# Patient Record
Sex: Female | Born: 1995 | Race: Black or African American | Hispanic: No | Marital: Single | Smoking: Former smoker
Health system: Southern US, Community
[De-identification: ages and names within clinical notes are randomized; demographics above are authoritative.]

## PROBLEM LIST (undated history)

## (undated) ENCOUNTER — Inpatient Hospital Stay (HOSPITAL_COMMUNITY): Payer: Self-pay

## (undated) DIAGNOSIS — A6000 Herpesviral infection of urogenital system, unspecified: Secondary | ICD-10-CM

## (undated) DIAGNOSIS — N390 Urinary tract infection, site not specified: Secondary | ICD-10-CM

## (undated) DIAGNOSIS — I440 Atrioventricular block, first degree: Secondary | ICD-10-CM

## (undated) HISTORY — PX: NO PAST SURGERIES: SHX2092

---
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
## 2014-08-14 IMAGING — US US OB TRANSVAGINAL
1 series · 14 of 28 positions shown · non-contrast
Comparison: None for this pregnancy

CLINICAL DATA: Abdominal and pelvic pain, early pregnancy, history
of spontaneous abortion x3; quantitative beta HCG 7301

OBSTETRIC <14 WK US AND TRANSVAGINAL OB US
TECHNIQUE: Both transabdominal and transvaginal ultrasound
examinations were performed for complete evaluation of the
gestation as well as the maternal uterus, adnexal regions, and
pelvic cul-de-sac.  Transvaginal technique was performed to assess
early pregnancy.

[Series 1: us ob comp less 14 wks · 14 of 40 slices shown]
[im 2/40]
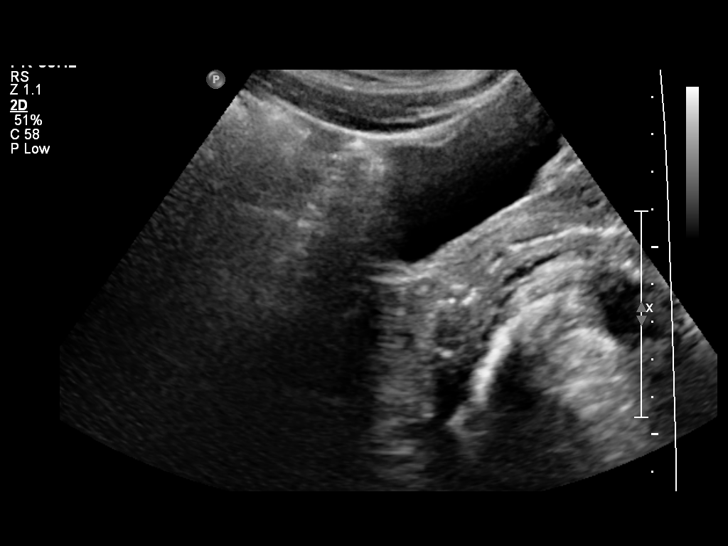
[im 5/40]
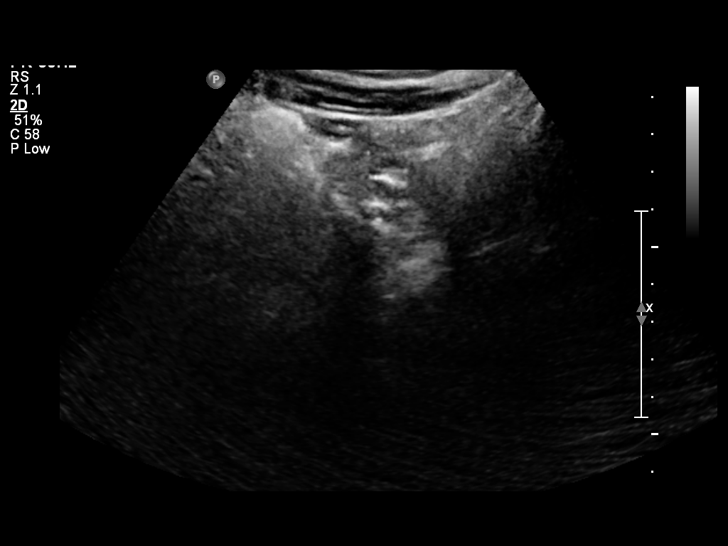
[im 8/40]
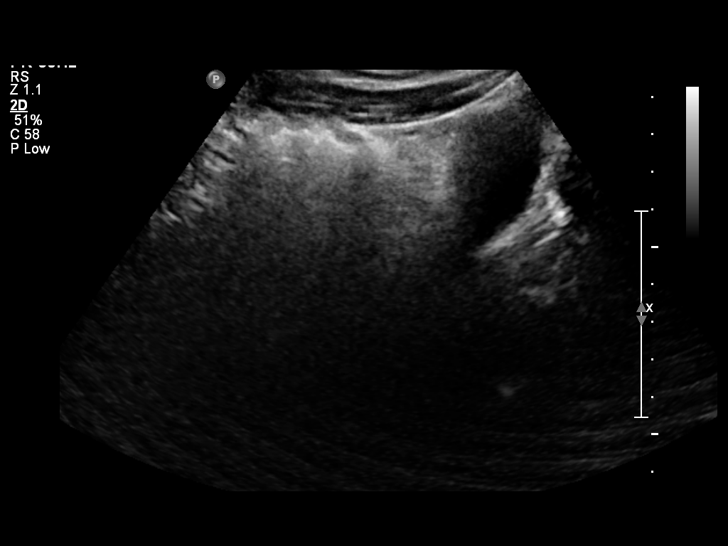
[im 11/40]
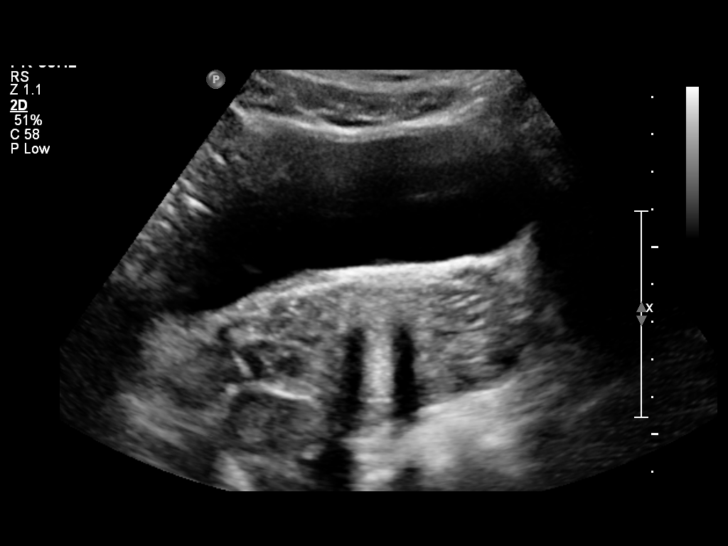
[im 14/40]
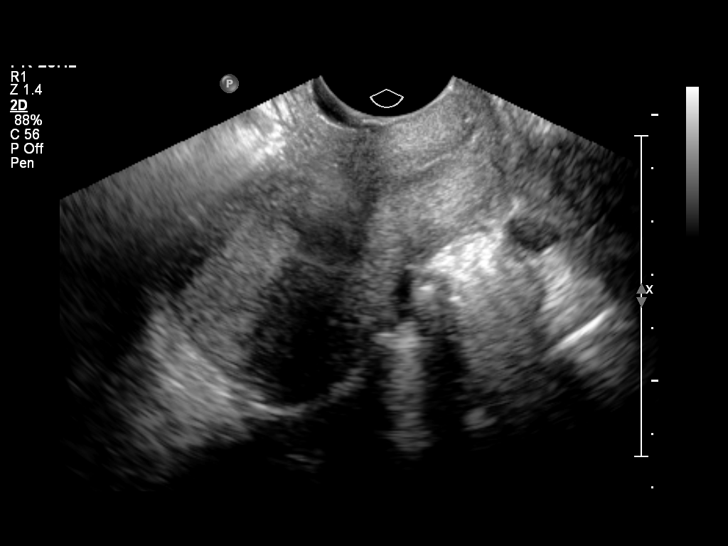
[im 16/40]
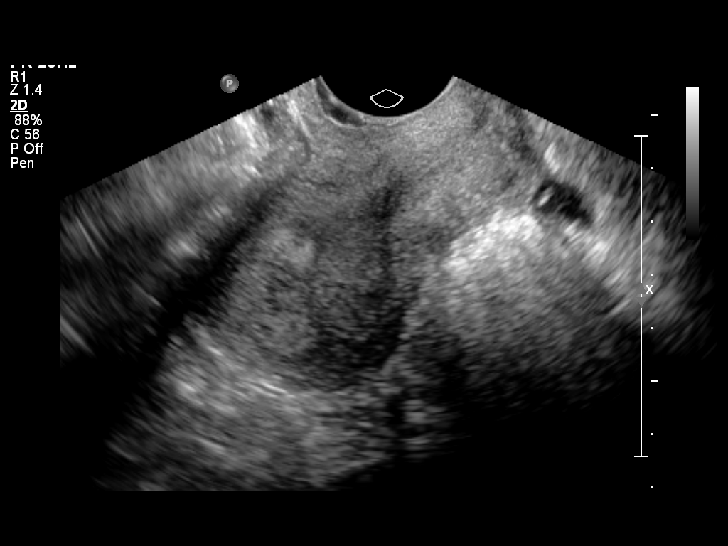
[im 19/40]
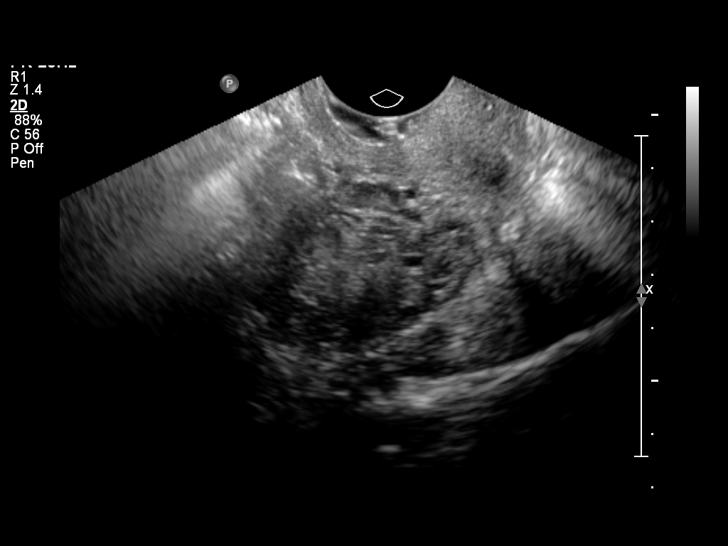
[im 22/40]
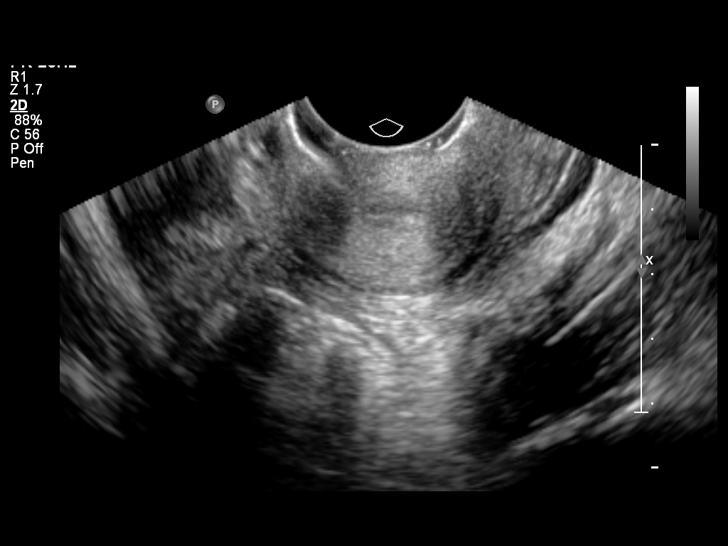
[im 25/40]
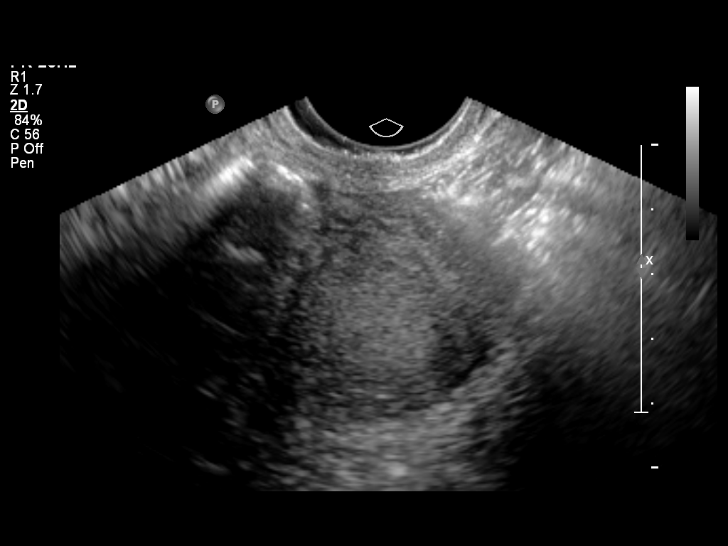
[im 28/40]
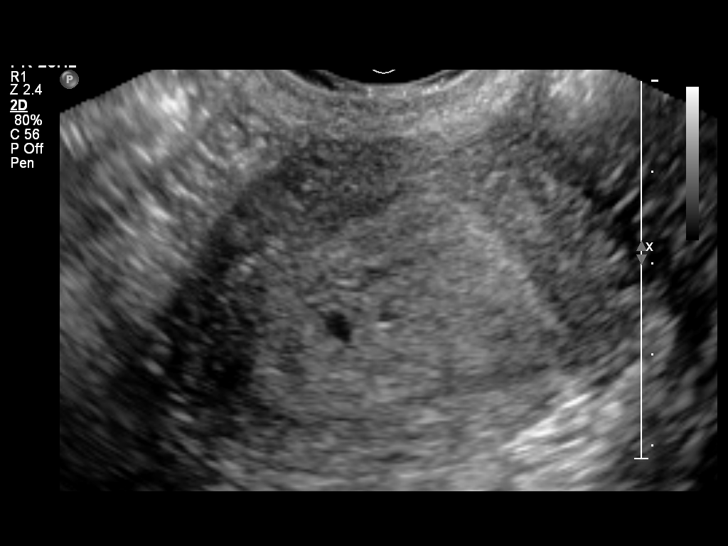
[im 31/40]
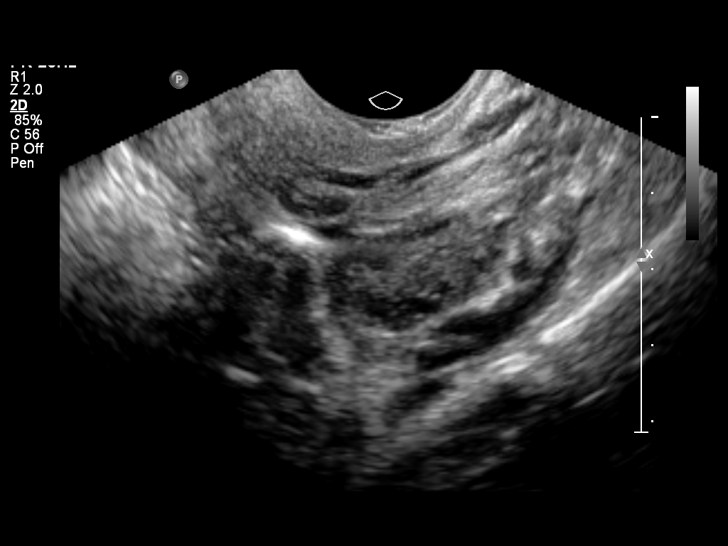
[im 34/40]
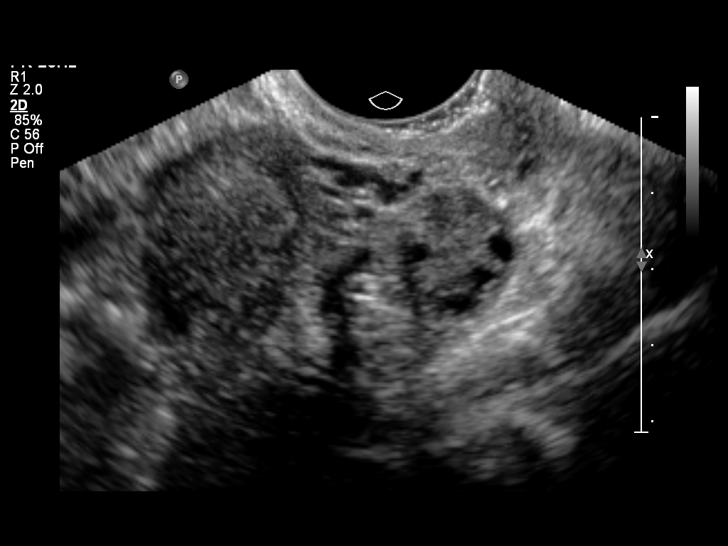
[im 37/40]
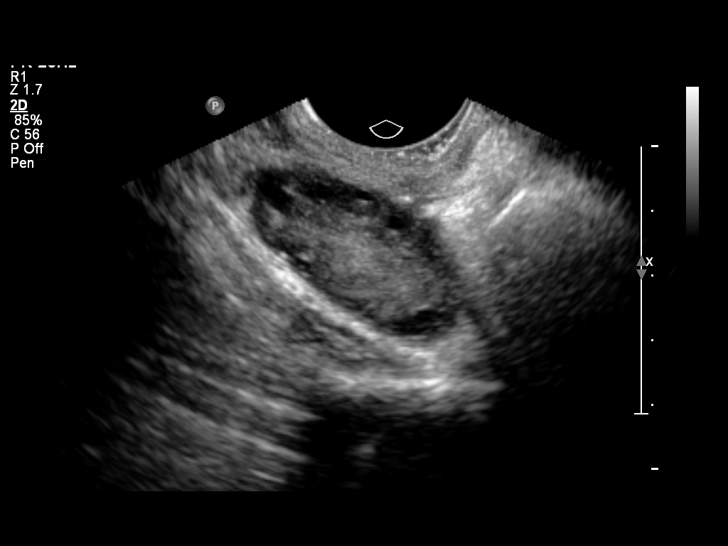
[im 40/40]
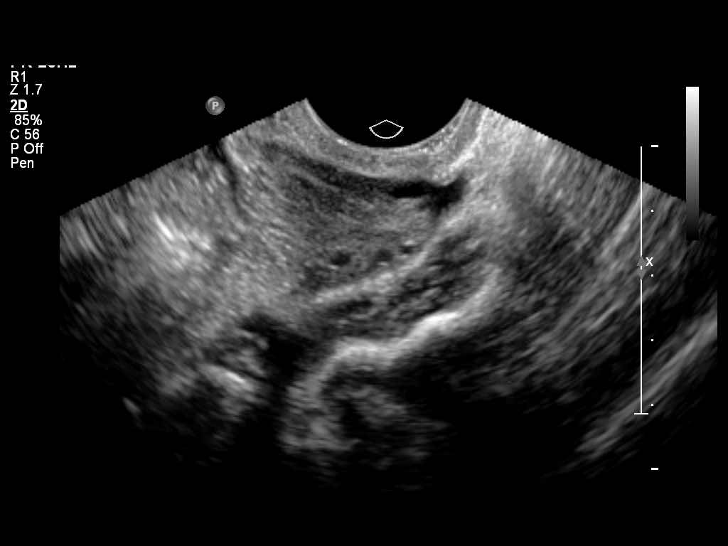

[14 of 28 positions shown; findings below may reference images not displayed]

Intrauterine gestational sac:  Probable tiny gestational sac within
uterus
Yolk sac: Not identified
Embryo: Not identified
Cardiac Activity: N/A
Heart Rate: N/A bpm

MSD: 3.9 mm             4 w 6 d                US EDC: 04/09/2013

Maternal uterus/adnexae:
No subchorionic hemorrhage.
Left ovary normal size and morphology 1.8 x 2.6 x 1.5 cm.
Right ovary normal size and morphology, 3.9 x 2.1 x 2.2 cm.
No adnexal masses or free pelvic fluid.
IMPRESSION: Tiny probable gestational sac within uterus with mean sac diameter
corresponding to 4 weeks 6 days EGA.
No fetal contents are yet identified.
Consider follow-up ultrasound imaging in 14 days to assess
viability.
